# Patient Record
Sex: Male | Born: 2000 | Race: White | Hispanic: No | Marital: Single | Smoking: Current some day smoker
Health system: Southern US, Community
[De-identification: ages and names within clinical notes are randomized; demographics above are authoritative.]

## PROBLEM LIST (undated history)

## (undated) DIAGNOSIS — R278 Other lack of coordination: Secondary | ICD-10-CM

## (undated) DIAGNOSIS — F902 Attention-deficit hyperactivity disorder, combined type: Secondary | ICD-10-CM

## (undated) DIAGNOSIS — M419 Scoliosis, unspecified: Secondary | ICD-10-CM

## (undated) DIAGNOSIS — F411 Generalized anxiety disorder: Secondary | ICD-10-CM

## (undated) HISTORY — DX: Generalized anxiety disorder: F41.1

## (undated) HISTORY — DX: Other lack of coordination: R27.8

## (undated) HISTORY — DX: Scoliosis, unspecified: M41.9

## (undated) HISTORY — DX: Attention-deficit hyperactivity disorder, combined type: F90.2

---
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
## 2019-04-14 ENCOUNTER — Other Ambulatory Visit: Payer: Self-pay

## 2019-04-14 DIAGNOSIS — Z20822 Contact with and (suspected) exposure to covid-19: Secondary | ICD-10-CM

## 2019-04-15 LAB — NOVEL CORONAVIRUS, NAA: SARS-CoV-2, NAA: NOT DETECTED

## 2019-04-16 ENCOUNTER — Other Ambulatory Visit: Payer: Self-pay

## 2019-04-16 DIAGNOSIS — Z20822 Contact with and (suspected) exposure to covid-19: Secondary | ICD-10-CM

## 2019-04-17 LAB — NOVEL CORONAVIRUS, NAA: SARS-CoV-2, NAA: NOT DETECTED

## 2019-06-25 ENCOUNTER — Ambulatory Visit: Payer: 59 | Attending: Internal Medicine

## 2019-06-25 DIAGNOSIS — Z20822 Contact with and (suspected) exposure to covid-19: Secondary | ICD-10-CM

## 2019-06-27 LAB — NOVEL CORONAVIRUS, NAA: SARS-CoV-2, NAA: NOT DETECTED
